# Patient Record
Sex: Male | Born: 1990 | Race: White | Hispanic: No | Marital: Single | Smoking: Never smoker
Health system: Southern US, Community
[De-identification: ages and names within clinical notes are randomized; demographics above are authoritative.]

## PROBLEM LIST (undated history)

## (undated) DIAGNOSIS — B2790 Infectious mononucleosis, unspecified without complication: Secondary | ICD-10-CM

## (undated) DIAGNOSIS — L708 Other acne: Secondary | ICD-10-CM

## (undated) DIAGNOSIS — T7840XA Allergy, unspecified, initial encounter: Secondary | ICD-10-CM

## (undated) HISTORY — DX: Other acne: L70.8

## (undated) HISTORY — DX: Infectious mononucleosis, unspecified without complication: B27.90

## (undated) HISTORY — DX: Allergy, unspecified, initial encounter: T78.40XA

---
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
## 2009-06-14 ENCOUNTER — Telehealth: Payer: Self-pay | Admitting: Internal Medicine

## 2009-07-30 ENCOUNTER — Telehealth: Payer: Self-pay | Admitting: Internal Medicine

## 2009-08-06 ENCOUNTER — Telehealth: Payer: Self-pay | Admitting: Internal Medicine

## 2009-08-09 ENCOUNTER — Ambulatory Visit: Payer: Self-pay | Admitting: Internal Medicine

## 2009-08-16 LAB — CONVERTED CEMR LAB
ALT: 17 units/L (ref 0–53)
AST: 22 units/L (ref 0–37)
BUN: 19 mg/dL (ref 6–23)
Calcium: 9.6 mg/dL (ref 8.4–10.5)
Eosinophils Relative: 0.8 % (ref 0.0–5.0)
GFR calc non Af Amer: 96.09 mL/min (ref >60)
HCT: 46.9 % (ref 39.0–52.0)
Hemoglobin: 16.4 g/dL (ref 13.0–17.0)
Lymphs Abs: 2.8 10*3/uL (ref 0.7–4.0)
MCHC: 34.9 g/dL (ref 30.0–36.0)
Neutro Abs: 3.3 10*3/uL (ref 1.4–7.7)
Total Bilirubin: 0.5 mg/dL (ref 0.3–1.2)
WBC: 6.7 10*3/uL (ref 4.5–10.5)

## 2009-12-24 ENCOUNTER — Ambulatory Visit: Payer: Self-pay | Admitting: Internal Medicine

## 2010-02-05 NOTE — Progress Notes (Signed)
Summary: Lotrisone not effective  Phone Note Call from Patient Call back at [phone]   Caller: Mom Call For: Stacie Glaze MD Reason for Call: Acute Illness Complaint: Urinary/GYN Problems Summary of Call: Triage VM from pt mother reporting the Lotrisone is not working.  It's been 3 weeks and symptoms seem to be getting worse [facility] Initial call taken by: Sid Falcon LPN,  July 30, 2009 4:16 PM  Follow-up for Phone Call        ov with dr Abner Greenspan for tomorrow Follow-up by: Willy Eddy, LPN,  July 30, 2009 4:25 PM

## 2010-02-05 NOTE — Progress Notes (Signed)
Summary: Lotrisone  Phone Note Call from Patient   Caller: Patient Call For: Stacie Glaze MD Summary of Call: [facility]) Refill Lotrisone for fungus patches on arm, shoulder and abdomen. [phone] Initial call taken by: Lynann Beaver CMA,  June 14, 2009 3:11 PM    Prescriptions: LOTRISONE 1-0.05 % LOTN (CLOTRIMAZOLE-BETAMETHASONE) Apply to affected area twice a day for 7 to 14 days  #60gm x 0   Entered by:   Lynann Beaver CMA   Authorized by:   Stacie Glaze MD   Signed by:   Lynann Beaver CMA on 06/14/2009   Method used:   Electronically to        [facility]. [phone]* (retail)       [address]       [place], [place]  [address]       Ph: [phone]       Fax: [phone]   RxID:   [id number]  Refill x 1 per Dr. Lovell Sheehan

## 2010-02-05 NOTE — Progress Notes (Signed)
Summary: REQ FOR CPX APPT  Phone Note Call from Patient   Caller: Mom Summary of Call: Pts mom called to reschedule her son's bumped appt..... His appt was for today, 8/1 ..... Pts mom adv that her son leaves to go back to college on 8/13 and willl need to have his cpx before he leaves.... would like to see if pt can be worked into the schedule before 8/13.... Pts mom can be reached at  [phone] with any questions or concerns.   Initial call taken by: Debbra Riding,  August 06, 2009 8:55 AM  Follow-up for Phone Call        Tim Lair may add him to wednesday afternoon this week Follow-up by: Stacie Glaze MD,  August 06, 2009 11:46 PM  Additional Follow-up for Phone Call Additional follow up Details #1::        Pt has another appt that afternoon.  Scheduled for 8/4 @ 9:15pm Additional Follow-up by: Trixie Dredge,  August 07, 2009 9:17 AM

## 2010-02-05 NOTE — Assessment & Plan Note (Signed)
Summary: CPX//SLM   Vital Signs:  Patient profile:   20 year old male Height:      73 inches Weight:      163 pounds BMI:     21.58 Temp:     97.8 degrees F oral Pulse rate:   79 / minute BP sitting:   110 / 78  (left arm) Cuff size:   regular  Vitals Entered By: Kathrynn Speed CMA (August 09, 2009 9:05 AM) CC: cpx, not fasting, src Is Patient Diabetic? No Pain Assessment Patient in pain? no      Nutritional Status BMI of 19 -24 = normal  Does patient need assistance? Ambulation Normal   CC:  cpx, not fasting, and src.  History of Present Illness: Pt is  [employer] at [employer] in psych and going pre med no problems had breakfast The pt was asked about all immunizations, health maint. 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 Apply To Affected Area Twice A Day For 7 To 14 Days  Current Medications (verified): 1)  Lotrisone 1-0.05 % Lotn (Clotrimazole-Betamethasone) .... Apply To Affected Area Twice A Day For 7 To 14 Days  Allergies (verified): No Known Drug Allergies  Past History:  Family History: Last updated: 07/19/2007 Family History Hypertension  Social History: Last updated: 07/19/2007 Occupation: Never Smoked  Risk Factors: Caffeine Use: limited (08/09/2009)  Risk Factors: Smoking Status: never (08/09/2009)  Past medical, surgical, family and social histories (including risk factors) reviewed,  and no changes noted (except as noted below).  Past Medical History: Reviewed history from 07/28/2006 and no changes required. acne 706.1 mononuecelosis/hx of075.0 Allergic rhinitis  Family History: Reviewed history from 07/19/2007 and no changes required. Family History Hypertension  Social History: Reviewed history from 07/19/2007 and no changes required. Occupation: Never Smoked Caffeine use/day:  limited  Review of Systems  The patient denies anorexia, fever, weight loss, weight gain, vision loss, decreased hearing, hoarseness, chest pain, syncope, dyspnea on exertion, peripheral edema, prolonged cough, headaches, hemoptysis, abdominal pain, melena, hematochezia, severe indigestion/heartburn, hematuria, incontinence, genital sores, muscle weakness, suspicious skin lesions, transient blindness, difficulty walking, depression, unusual weight change, abnormal bleeding, enlarged lymph nodes, angioedema, breast masses, and testicular masses.    Physical Exam  General:  alert, well-developed, and well-nourished.   Head:  normocephalic and atraumatic.   Eyes:  pupils equal and pupils round.   Ears:  R ear normal and L ear normal.   Nose:  no external deformity and no nasal discharge.   Lungs:  normal respiratory effort and no wheezes.   Abdomen:  Bowel sounds positive,abdomen soft and non-tender without masses, organomegaly or hernias noted. Rectal:  No external abnormalities noted. Normal sphincter tone. No rectal masses or tenderness. Genitalia:  Testes bilaterally descended without nodularity, tenderness or masses. No scrotal masses or lesions. No penis lesions or urethral discharge. Prostate:  Prostate gland firm and smooth, no enlargement, nodularity, tenderness, mass, asymmetry or induration. Msk:  No deformity or scoliosis noted of thoracic or lumbar spine.   Pulses:  R and L carotid,radial,femoral,dorsalis pedis and posterior tibial pulses are full and equal  bilaterally Extremities:  No clubbing, cyanosis, edema, or deformity noted with normal full range of motion of all joints.   Neurologic:  No cranial nerve deficits noted. Station and gait are normal. Plantar reflexes are down-going bilaterally. DTRs are symmetrical throughout. Sensory, motor and coordinative functions appear intact.   Impression & Recommendations:  Problem # 1:  PREVENTIVE HEALTH CARE (ICD-V70.0) The pt was asked about all immunizations, health maint. services that are appropriate to their age and was given guidance on diet exercize  and weight management  Td Booster: Tdap (State) (11/10/2005)   Flu Vax: Fluvax 3+ (12/26/2008)    Discussed using sunscreen, use of alcohol, drug use, self testicular exam, routine dental care, routine eye care, routine physical exam, seat belts, multiple vitamins, osteoporosis prevention, adequate calcium intake in diet, and recommendations for immunizations.  Discussed exercise and checking cholesterol.  Discussed gun safety, safe sex, and contraception. Also recommend checking PSA.  Problem # 2:  PITYRIASIS VERSICOLOR (ICD-111.0)  His updated medication list for this problem includes:    Lotrisone 1-0.05 % Lotn (Clotrimazole-betamethasone) .Marland Kitchen... Apply to affected area twice a day for 7 to 14 days  Take medication as directed for full duration.   Complete Medication List: 1)  Lotrisone 1-0.05 % Lotn (Clotrimazole-betamethasone) .... Apply to affected area twice a day for 7 to 14 days  Patient Instructions: 1)  Please schedule a follow-up appointment in 1 year.     Orders Added: 1)  Est. Patient Level IV [16109] 2)  Est. Patient 18-39 years [99395]    Prevention & Chronic Care Immunizations   Influenza vaccine: Fluvax 3+  (12/26/2008)   Influenza vaccine due: 09/06/2009    Tetanus booster: 11/10/2005: Tdap [facility])   Tetanus booster due: 11/11/2015    Pneumococcal vaccine: Not documented  Other Screening   Smoking status:  never  (08/09/2009)  Appended Document: Orders Update    Clinical Lists Changes  Orders: Added new Service order of Venipuncture (60454) - Signed Added new Test order of TLB-BMP (Basic Metabolic Panel-BMET) (80048-METABOL) - Signed Added new Test order of TLB-CBC Platelet - w/Differential (85025-CBCD) - Signed Added new Test order of TLB-Hepatic/Liver Function Pnl (80076-HEPATIC) - Signed Added new Test order of TLB-TSH (Thyroid Stimulating Hormone) (84443-TSH) - Signed Added new Test order of TLB-Cholesterol, HDL (83718-HDL) - Signed Added new Test order of TLB-Cholesterol, Direct LDL (83721-DIRLDL) - Signed Added new Test order of TLB-Cholesterol, Total (82465-CHO) - Signed

## 2010-02-07 NOTE — Assessment & Plan Note (Signed)
Summary: FLU SHOT//ALP PT WILL COME IN @ 12:30PM//ALP  Nurse Visit   Review of Systems       Flu Vaccine Consent Questions     Do you have a history of severe allergic reactions to this vaccine? no    Any prior history of allergic reactions to egg and/or gelatin? no    Do you have a sensitivity to the preservative Thimersol? no    Do you have a past history of Guillan-Barre Syndrome? no    Do you currently have an acute febrile illness? no    Have you ever had a severe reaction to latex? no    Vaccine information given and explained to patient? yes    Are you currently pregnant? no    Lot Number:[id number]   Exp Date:07/06/2010   Site Given  Left Deltoid IM    Allergies: No Known Drug Allergies  Orders Added: 1)  Admin 1st Vaccine [90471] 2)  Flu Vaccine 70yrs + [11914]

## 2010-05-27 ENCOUNTER — Other Ambulatory Visit: Payer: Self-pay | Admitting: Internal Medicine

## 2010-07-30 ENCOUNTER — Encounter: Payer: Self-pay | Admitting: Internal Medicine

## 2010-07-30 ENCOUNTER — Ambulatory Visit (INDEPENDENT_AMBULATORY_CARE_PROVIDER_SITE_OTHER): Payer: BC Managed Care – PPO | Admitting: Internal Medicine

## 2010-07-30 DIAGNOSIS — M549 Dorsalgia, unspecified: Secondary | ICD-10-CM

## 2010-07-30 DIAGNOSIS — S91319A Laceration without foreign body, unspecified foot, initial encounter: Secondary | ICD-10-CM

## 2010-07-30 DIAGNOSIS — S91309A Unspecified open wound, unspecified foot, initial encounter: Secondary | ICD-10-CM
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
## 2011-01-03 ENCOUNTER — Ambulatory Visit (INDEPENDENT_AMBULATORY_CARE_PROVIDER_SITE_OTHER): Payer: BC Managed Care – PPO | Admitting: Family

## 2011-01-03 ENCOUNTER — Encounter: Payer: Self-pay | Admitting: Family

## 2011-01-03 VITALS — BP 120/80 | Temp 98.2°F | Wt 188.0 lb

## 2011-01-03 DIAGNOSIS — M26629 Arthralgia of temporomandibular joint, unspecified side: Secondary | ICD-10-CM

## 2011-01-03 MED ORDER — METHYLPREDNISOLONE ACETATE 80 MG/ML IJ SUSP
80.0000 mg | Freq: Once | INTRAMUSCULAR | Status: AC
  Administered 2011-01-03: 80 mg via INTRAMUSCULAR

## 2011-01-03 MED ORDER — MELOXICAM 15 MG PO TABS: 15.0000 mg | ORAL_TABLET | Freq: Every day | ORAL | Status: AC

## 2011-01-03 MED ORDER — METHYLPREDNISOLONE ACETATE 80 MG/ML IJ SUSP: 80.0000 mg | Freq: Once | INTRAMUSCULAR | Status: DC

## 2011-01-03 NOTE — Progress Notes (Signed)
  Subjective:    Patient ID: Timothy Nash, male    DOB: 04-04-1990, 20 y.o.   MRN: [id number]  HPI 20 year old white male, nonsmoker, patient of Dr. Lovell Sheehan is him with a one-day history of left jaw pain. He denies any recent cough and, congestion, dental pain, fever, and muscle aches.    Review of Systems  HENT: Positive for rhinorrhea.   Respiratory: Negative.   Cardiovascular: Negative.   Musculoskeletal: Negative.   Skin: Negative.   Neurological: Negative.   Hematological: Negative.   Psychiatric/Behavioral: Negative.    Past Medical History  Diagnosis Date  . Other acne   . Infectious mononucleosis history of  . Allergy     History   Social History  . Marital Status: Single    Spouse Name: N/A    Number of Children: N/A  . Years of Education: N/A   Occupational History  . Not on file.   Social History Main Topics  . Smoking status: Never Smoker   . Smokeless tobacco: Not on file  . Alcohol Use: No  . Drug Use: No  . Sexually Active: Yes   Other Topics Concern  . Not on file   Social History Narrative  . No narrative on file    No past surgical history on file.  Family History  Problem Relation Age of Onset  . Hypertension      No Known Allergies  Current Outpatient Prescriptions on File Prior to Visit  Medication Sig Dispense Refill  . clotrimazole-betamethasone (LOTRISONE) lotion APPLY TO AFFECTED AREA TWICE DAILY FOR 7 TO 14 DAYS  60 mL  0   No current facility-administered medications on file prior to visit.    BP 120/80  Temp(Src) 98.2 F (36.8 C) (Oral)  Wt 188 lb (85.276 kg)chart    Objective:   Physical Exam  Constitutional: He is oriented to person, place, and time. He appears well-developed and well-nourished.  HENT:  Right Ear: External ear normal.  Left Ear: External ear normal.  Mouth/Throat: Oropharynx is clear and moist.  Neck: Normal range of motion. Neck supple.       Tenderness to palpation of the left TMJ. Pain with  open and closing opening the jaw.  Cardiovascular: Normal rate, regular rhythm and normal heart sounds.   Pulmonary/Chest: Effort normal and breath sounds normal.  Musculoskeletal: Normal range of motion.  Neurological: He is alert and oriented to person, place, and time.  Skin: Skin is warm and dry.          Assessment & Plan:  Assessment: TMJ  Plan: Depo-Medrol 80 mg IM x1. Mobic 15 mg one by mouth daily. Compresses to the affected area. Patient to call if symptoms worsen or persist. Recheck as scheduled, and when necessary.

## 2011-01-03 NOTE — Patient Instructions (Signed)
Temporomandibular Joint Pain Your exam shows that you have a problem with your temporomandibular joint (TMJ), the joint that moves when you open your mouth or chew food. TMJ problems can result from direct injuries, bite abnormalities, or tension states which cause you to grind or clench your teeth. Typical symptoms include pain around the joint, clicking, restricted movement, and headaches. The TMJ is like any other joint in the body; when it is strained, it needs rest to repair itself. To keep the joint at rest it is important that you do not open your mouth wider than the width of your index finger. If you must yawn, be sure to support your chin with your hand so your mouth does not open wide. Eat a soft diet (nothing firmer than ground beef, no raw vegetables), do not chew gum and do not talk if it causes you pain. Apply topical heat by using a warm, moist cloth placed in front of the ear for 15 to 20 minutes several times daily. Alternating heat and ice may give even more relief. Anti-inflammatory pain medicine and muscle relaxants can also be helpful. A dental orthotic or splint may be used for temporary relief. Long-term problems may require treatment for stress as well as braces or surgery. Please check with your doctor or dentist if your symptoms do not improve within one week. Document Released: 01/31/2004 Document Revised: 09/04/2010 Document Reviewed: 12/23/2004 [facility] Patient Information 2012 [place], [place].

## 2011-01-06 ENCOUNTER — Ambulatory Visit: Payer: BC Managed Care – PPO | Admitting: Family

## 2011-12-22 ENCOUNTER — Other Ambulatory Visit: Payer: BC Managed Care – PPO

## 2011-12-24 ENCOUNTER — Other Ambulatory Visit: Payer: Self-pay | Admitting: *Deleted

## 2011-12-25 ENCOUNTER — Other Ambulatory Visit (INDEPENDENT_AMBULATORY_CARE_PROVIDER_SITE_OTHER): Payer: BC Managed Care – PPO

## 2011-12-25 DIAGNOSIS — Z0000 Encounter for general adult medical examination without abnormal findings: Secondary | ICD-10-CM

## 2011-12-25 LAB — CBC WITH DIFFERENTIAL/PLATELET
Basophils Absolute: 0 10*3/uL (ref 0.0–0.1)
Basophils Relative: 0.7 % (ref 0.0–3.0)
Eosinophils Absolute: 0.1 10*3/uL (ref 0.0–0.7)
Eosinophils Relative: 2.2 % (ref 0.0–5.0)
Hemoglobin: 16.4 g/dL (ref 13.0–17.0)
Lymphocytes Relative: 45.2 % (ref 12.0–46.0)
Lymphs Abs: 2.7 10*3/uL (ref 0.7–4.0)
MCHC: 34.3 g/dL (ref 30.0–36.0)
MCV: 90.1 fl (ref 78.0–100.0)
Monocytes Absolute: 0.6 10*3/uL (ref 0.1–1.0)
Monocytes Relative: 9.7 % (ref 3.0–12.0)
Neutrophils Relative %: 42.2 % — ABNORMAL LOW (ref 43.0–77.0)
Platelets: 203 10*3/uL (ref 150.0–400.0)
RBC: 5.3 Mil/uL (ref 4.22–5.81)
WBC: 5.9 10*3/uL (ref 4.5–10.5)

## 2011-12-25 LAB — BASIC METABOLIC PANEL
Calcium: 9.1 mg/dL (ref 8.4–10.5)
Chloride: 103 mEq/L (ref 96–112)
Creatinine, Ser: 1 mg/dL (ref 0.4–1.5)
Glucose, Bld: 91 mg/dL (ref 70–99)
Sodium: 140 mEq/L (ref 135–145)

## 2011-12-25 LAB — POCT URINALYSIS DIPSTICK
Bilirubin, UA: NEGATIVE
Ketones, UA: NEGATIVE
Leukocytes, UA: NEGATIVE
Nitrite, UA: NEGATIVE
Protein, UA: NEGATIVE
pH, UA: 7

## 2011-12-25 LAB — LIPID PANEL
HDL: 87.9 mg/dL (ref >39.00)
Total CHOL/HDL Ratio: 2
Triglycerides: 62 mg/dL (ref 0.0–149.0)

## 2011-12-25 LAB — HEPATIC FUNCTION PANEL
AST: 19 U/L (ref 0–37)
Alkaline Phosphatase: 77 U/L (ref 39–117)

## 2011-12-25 LAB — TSH: TSH: 0.63 u[IU]/mL (ref 0.35–5.50)

## 2011-12-29 ENCOUNTER — Encounter: Payer: BC Managed Care – PPO | Admitting: Internal Medicine

## 2012-01-06 ENCOUNTER — Ambulatory Visit (INDEPENDENT_AMBULATORY_CARE_PROVIDER_SITE_OTHER): Payer: BC Managed Care – PPO | Admitting: Internal Medicine

## 2012-01-06 ENCOUNTER — Encounter: Payer: Self-pay | Admitting: Internal Medicine

## 2012-01-06 VITALS — BP 130/80 | HR 68 | Temp 98.3°F | Resp 14 | Ht 73.0 in | Wt 188.0 lb

## 2012-01-06 DIAGNOSIS — Z0000 Encounter for general adult medical examination without abnormal findings: Secondary | ICD-10-CM

## 2012-01-06 NOTE — Progress Notes (Signed)
  Subjective:    Patient ID: Timothy Nash, male    DOB: 1990/08/11, 21 y.o.   MRN: [id number]  HPI CPX  No complaints Cold symptoms and PNDrip  Review of Systems  Constitutional: Negative for fever and fatigue.  HENT: Negative for hearing loss, congestion, neck pain and postnasal drip.   Eyes: Negative for discharge, redness and visual disturbance.  Respiratory: Negative for cough, shortness of breath and wheezing.   Cardiovascular: Negative for leg swelling.  Gastrointestinal: Negative for abdominal pain, constipation and abdominal distention.  Genitourinary: Negative for urgency and frequency.  Musculoskeletal: Negative for joint swelling and arthralgias.  Skin: Negative for color change and rash.  Neurological: Negative for weakness and light-headedness.  Hematological: Negative for adenopathy.  Psychiatric/Behavioral: Negative for behavioral problems.   Past Medical History  Diagnosis Date  . Other acne   . Infectious mononucleosis history of  . Allergy     History   Social History  . Marital Status: Single    Spouse Name: N/A    Number of Children: N/A  . Years of Education: N/A   Occupational History  . Not on file.   Social History Main Topics  . Smoking status: Never Smoker   . Smokeless tobacco: Not on file  . Alcohol Use: No  . Drug Use: No  . Sexually Active: Yes   Other Topics Concern  . Not on file   Social History Narrative  . No narrative on file    No past surgical history on file.  Family History  Problem Relation Age of Onset  . Hypertension      No Known Allergies  No current outpatient prescriptions on file prior to visit.    BP 130/80  Pulse 68  Temp 98.3 F (36.8 C)  Resp 14  Ht 6\' 1"  (1.854 m)  Wt 188 lb (85.276 kg)  BMI 24.80 kg/m2       Objective:   Physical Exam  Nursing note and vitals reviewed. Constitutional: He is oriented to person, place, and time. He appears well-developed and well-nourished.  HENT:    Head: Normocephalic and atraumatic.  Eyes: Conjunctivae normal are normal. Pupils are equal, round, and reactive to light.  Neck: Normal range of motion. Neck supple.  Cardiovascular: Normal rate and regular rhythm.   Pulmonary/Chest: Effort normal and breath sounds normal.  Abdominal: Soft. Bowel sounds are normal.  Genitourinary: Penis normal.  Neurological: He is alert and oriented to person, place, and time.  Skin: Skin is warm and dry.          Assessment & Plan:   Patient presents for yearly preventative medicine examination.   all immunizations and health maintenance protocols were reviewed with the patient and they are up to date with these protocols.   screening laboratory values were reviewed with the patient including screening of hyperlipidemia PSA renal function and hepatic function.   There medications past medical history social history problem list and allergies were reviewed in detail.   Goals were established with regard to weight loss exercise diet in compliance with medications

## 2012-01-06 NOTE — Patient Instructions (Signed)
The patient is instructed to continue all medications as prescribed. Schedule followup with check out clerk upon leaving the clinic  

## 2012-07-02 ENCOUNTER — Telehealth: Payer: Self-pay | Admitting: Internal Medicine

## 2012-07-02 MED ORDER — KETOCONAZOLE 2 % EX SHAM: MEDICATED_SHAMPOO | CUTANEOUS | Status: DC

## 2012-07-02 NOTE — Telephone Encounter (Signed)
Pt mother is requesting nizoral cream call into [facility] [address] for white spots on back. Pt mother stated patient get this every yr. Pt unable to come in lives in [place] and will be going to [place] on 07-10-12

## 2012-08-16 ENCOUNTER — Telehealth: Payer: Self-pay | Admitting: Internal Medicine

## 2012-08-16 NOTE — Telephone Encounter (Signed)
yes

## 2012-08-16 NOTE — Telephone Encounter (Signed)
Pt needs cpx after 12/30 and before 01/12/13. Is it ok to work in the first week Jan?

## 2012-08-17 NOTE — Telephone Encounter (Signed)
appt made/kh 

## 2013-01-03 ENCOUNTER — Other Ambulatory Visit: Payer: BC Managed Care – PPO

## 2013-01-10 ENCOUNTER — Encounter: Payer: Self-pay | Admitting: Internal Medicine

## 2013-01-10 ENCOUNTER — Other Ambulatory Visit (INDEPENDENT_AMBULATORY_CARE_PROVIDER_SITE_OTHER): Payer: No Typology Code available for payment source

## 2013-01-10 ENCOUNTER — Ambulatory Visit (INDEPENDENT_AMBULATORY_CARE_PROVIDER_SITE_OTHER): Payer: No Typology Code available for payment source | Admitting: Internal Medicine

## 2013-01-10 VITALS — BP 130/80 | HR 76 | Temp 98.0°F | Resp 16 | Ht 73.0 in | Wt 188.0 lb

## 2013-01-10 DIAGNOSIS — Z0000 Encounter for general adult medical examination without abnormal findings: Secondary | ICD-10-CM

## 2013-01-10 DIAGNOSIS — S62523A Displaced fracture of distal phalanx of unspecified thumb, initial encounter for closed fracture: Secondary | ICD-10-CM

## 2013-01-10 DIAGNOSIS — L659 Nonscarring hair loss, unspecified: Secondary | ICD-10-CM

## 2013-01-10 DIAGNOSIS — S62639A Displaced fracture of distal phalanx of unspecified finger, initial encounter for closed fracture: Secondary | ICD-10-CM

## 2013-01-10 LAB — HEPATIC FUNCTION PANEL
ALK PHOS: 74 U/L (ref 39–117)
ALT: 25 U/L (ref 0–53)
AST: 19 U/L (ref 0–37)
Albumin: 4.7 g/dL (ref 3.5–5.2)
Bilirubin, Direct: 0.1 mg/dL (ref 0.0–0.3)
Total Bilirubin: 0.7 mg/dL (ref 0.3–1.2)
Total Protein: 7.5 g/dL (ref 6.0–8.3)

## 2013-01-10 LAB — LIPID PANEL
CHOL/HDL RATIO: 3
Cholesterol: 203 mg/dL — ABNORMAL HIGH (ref 0–200)
HDL: 68.2 mg/dL (ref >39.00)
TRIGLYCERIDES: 76 mg/dL (ref 0.0–149.0)
VLDL: 15.2 mg/dL (ref 0.0–40.0)

## 2013-01-10 LAB — BASIC METABOLIC PANEL
BUN: 13 mg/dL (ref 6–23)
CO2: 27 meq/L (ref 19–32)
Calcium: 9.6 mg/dL (ref 8.4–10.5)
Chloride: 105 mEq/L (ref 96–112)
Creatinine, Ser: 1.1 mg/dL (ref 0.4–1.5)
GFR: 85.45 mL/min (ref >60.00)
GLUCOSE: 88 mg/dL (ref 70–99)
Potassium: 4.7 mEq/L (ref 3.5–5.1)
Sodium: 140 mEq/L (ref 135–145)

## 2013-01-10 LAB — POCT URINALYSIS DIPSTICK
BILIRUBIN UA: NEGATIVE
Blood, UA: NEGATIVE
GLUCOSE UA: NEGATIVE
Ketones, UA: NEGATIVE
Nitrite, UA: NEGATIVE
PROTEIN UA: NEGATIVE
Spec Grav, UA: 1.02
Urobilinogen, UA: 0.2
pH, UA: 6.5

## 2013-01-10 LAB — CBC WITH DIFFERENTIAL/PLATELET
BASOS PCT: 0.5 % (ref 0.0–3.0)
Basophils Absolute: 0 10*3/uL (ref 0.0–0.1)
EOS PCT: 2.8 % (ref 0.0–5.0)
Eosinophils Absolute: 0.2 10*3/uL (ref 0.0–0.7)
HEMATOCRIT: 49.9 % (ref 39.0–52.0)
Hemoglobin: 17.1 g/dL — ABNORMAL HIGH (ref 13.0–17.0)
LYMPHS PCT: 52.3 % — AB (ref 12.0–46.0)
Lymphs Abs: 3.4 10*3/uL (ref 0.7–4.0)
MCHC: 34.2 g/dL (ref 30.0–36.0)
MCV: 90 fl (ref 78.0–100.0)
Monocytes Absolute: 0.6 10*3/uL (ref 0.1–1.0)
Monocytes Relative: 9.7 % (ref 3.0–12.0)
NEUTROS ABS: 2.3 10*3/uL (ref 1.4–7.7)
NEUTROS PCT: 34.7 % — AB (ref 43.0–77.0)
Platelets: 237 10*3/uL (ref 150.0–400.0)
RBC: 5.55 Mil/uL (ref 4.22–5.81)
RDW: 14.1 % (ref 11.5–14.6)
WBC: 6.6 10*3/uL (ref 4.5–10.5)

## 2013-01-10 LAB — LDL CHOLESTEROL, DIRECT: LDL DIRECT: 115.6 mg/dL

## 2013-01-10 LAB — TSH: TSH: 1.23 u[IU]/mL (ref 0.35–5.50)

## 2013-01-10 MED ORDER — MINOXIDIL 2 % EX SOLN: Freq: Every day | CUTANEOUS | Status: AC

## 2013-01-10 NOTE — Addendum Note (Signed)
Addended by: Stacie GlazeJENKINS, JOHN E on: 01/10/2013 04:48 PM   Modules accepted: Orders

## 2013-01-10 NOTE — Progress Notes (Signed)
   Subjective:    Patient ID: Timothy Nash, male    DOB: 1990-04-09, 23 y.o.   MRN: [id number]  HPI Scalp had used olux and this did not help Has very dry skin CPX   Review of Systems  Constitutional: Negative for fever and fatigue.  HENT: Negative for congestion, hearing loss and postnasal drip.   Eyes: Negative for discharge, redness and visual disturbance.  Respiratory: Negative for cough, shortness of breath and wheezing.   Cardiovascular: Negative for leg swelling.  Gastrointestinal: Negative for abdominal pain, constipation and abdominal distention.  Genitourinary: Negative for urgency and frequency.  Musculoskeletal: Negative for arthralgias, joint swelling and neck pain.  Skin: Negative for color change and rash.  Neurological: Negative for weakness and light-headedness.  Hematological: Negative for adenopathy.  Psychiatric/Behavioral: Negative for behavioral problems.   Past Medical History  Diagnosis Date  . Other acne   . Infectious mononucleosis history of  . Allergy     History   Social History  . Marital Status: Single    Spouse Name: N/A    Number of Children: N/A  . Years of Education: N/A   Occupational History  . Not on file.   Social History Main Topics  . Smoking status: Never Smoker   . Smokeless tobacco: Not on file  . Alcohol Use: No  . Drug Use: No  . Sexual Activity: Yes   Other Topics Concern  . Not on file   Social History Narrative  . No narrative on file    History reviewed. No pertinent past surgical history.  Family History  Problem Relation Age of Onset  . Hypertension      No Known Allergies  No current outpatient prescriptions on file prior to visit.   No current facility-administered medications on file prior to visit.    BP 130/80  Pulse 76  Temp(Src) 98 F (36.7 C)  Resp 16  Ht 6\' 1"  (1.854 m)  Wt 188 lb (85.276 kg)  BMI 24.81 kg/m2       Objective:   Physical Exam  Nursing note and vitals  reviewed. Constitutional: He appears well-developed and well-nourished.  HENT:  Head: Normocephalic and atraumatic.  Eyes: Conjunctivae are normal. Pupils are equal, round, and reactive to light.  Neck: Normal range of motion. Neck supple.  Cardiovascular: Normal rate and regular rhythm.   Pulmonary/Chest: Effort normal and breath sounds normal.  Abdominal: Soft. Bowel sounds are normal.          Assessment & Plan:  Hair loss.... Rogaine trial   Strong family history CPX  Patient presents for yearly preventative medicine examination.   all immunizations and health maintenance protocols were reviewed with the patient and they are up to date with these protocols.   screening laboratory values were reviewed with the patient including screening of hyperlipidemia PSA renal function and hepatic function.   There medications past medical history social history problem list and allergies were reviewed in detail.   Goals were established with regard to weight loss exercise diet in compliance with medications

## 2013-01-10 NOTE — Progress Notes (Signed)
Pre visit review using our clinic review tool, if applicable. No additional management support is needed unless otherwise documented below in the visit note. 

## 2013-01-11 ENCOUNTER — Other Ambulatory Visit: Payer: Self-pay | Admitting: Internal Medicine

## 2013-01-11 ENCOUNTER — Ambulatory Visit (INDEPENDENT_AMBULATORY_CARE_PROVIDER_SITE_OTHER)
Admission: RE | Admit: 2013-01-11 | Discharge: 2013-01-11 | Disposition: A | Discharge status: Home / Self Care | Payer: No Typology Code available for payment source | Source: Ambulatory Visit | Attending: Internal Medicine | Admitting: Internal Medicine

## 2013-01-11 DIAGNOSIS — S62639A Displaced fracture of distal phalanx of unspecified finger, initial encounter for closed fracture: Secondary | ICD-10-CM

## 2013-01-11 DIAGNOSIS — S62523A Displaced fracture of distal phalanx of unspecified thumb, initial encounter for closed fracture: Secondary | ICD-10-CM

## 2013-01-24 ENCOUNTER — Other Ambulatory Visit: Payer: Self-pay | Admitting: *Deleted

## 2013-01-24 ENCOUNTER — Telehealth: Payer: Self-pay | Admitting: Internal Medicine

## 2013-01-24 DIAGNOSIS — S62515D Nondisplaced fracture of proximal phalanx of left thumb, subsequent encounter for fracture with routine healing: Secondary | ICD-10-CM

## 2013-01-24 NOTE — Telephone Encounter (Signed)
Pt would like results from lab work done approximately 2 weeks ago.

## 2013-02-15 ENCOUNTER — Telehealth: Payer: Self-pay | Admitting: Internal Medicine

## 2013-02-15 ENCOUNTER — Other Ambulatory Visit: Payer: Self-pay | Admitting: *Deleted

## 2013-02-15 DIAGNOSIS — S62515G Nondisplaced fracture of proximal phalanx of left thumb, subsequent encounter for fracture with delayed healing: Secondary | ICD-10-CM

## 2013-02-15 NOTE — Telephone Encounter (Signed)
Pt notified, order in computer

## 2013-02-15 NOTE — Telephone Encounter (Signed)
Pt mother is requesting another xray to make sure her son thumb has healed.

## 2013-02-17 ENCOUNTER — Ambulatory Visit
Admission: RE | Admit: 2013-02-17 | Discharge: 2013-02-17 | Disposition: A | Discharge status: Home / Self Care | Payer: No Typology Code available for payment source | Source: Ambulatory Visit | Attending: Internal Medicine | Admitting: Internal Medicine

## 2013-02-17 ENCOUNTER — Ambulatory Visit (INDEPENDENT_AMBULATORY_CARE_PROVIDER_SITE_OTHER)
Admission: RE | Admit: 2013-02-17 | Discharge: 2013-02-17 | Disposition: A | Discharge status: Home / Self Care | Payer: No Typology Code available for payment source | Source: Ambulatory Visit | Attending: Internal Medicine | Admitting: Internal Medicine

## 2013-02-17 DIAGNOSIS — S62515G Nondisplaced fracture of proximal phalanx of left thumb, subsequent encounter for fracture with delayed healing: Secondary | ICD-10-CM

## 2013-02-17 DIAGNOSIS — S5290XD Unspecified fracture of unspecified forearm, subsequent encounter for closed fracture with routine healing: Secondary | ICD-10-CM

## 2013-02-25 ENCOUNTER — Other Ambulatory Visit: Payer: Self-pay | Admitting: *Deleted

## 2013-02-25 ENCOUNTER — Telehealth: Payer: Self-pay | Admitting: Internal Medicine

## 2013-02-25 DIAGNOSIS — IMO0002 Reserved for concepts with insufficient information to code with codable children: Secondary | ICD-10-CM

## 2013-02-25 NOTE — Telephone Encounter (Signed)
Pt mother is calling requesting someone call her son with xray results

## 2015-03-03 ENCOUNTER — Telehealth: Payer: Self-pay | Admitting: Internal Medicine

## 2015-03-05 NOTE — Telephone Encounter (Signed)
[facility] - Client TELEPHONE ADVICE RECORD [facility] Call [facility] Patient Name: Timothy Nash Gender: Male DOB: 09/21/1990 Age: 25 Y 2 M 1 D Return Phone Number: [phone] (Primary) Address: City/State/Zip:  Client [facility] - Client Client Site [facility] - Night Contact Type Call Who Is Calling Patient / Member / Family / Caregiver Call Type Triage / Clinical Caller Name Veryl Winemiller Relationship To Patient Mother Return Phone Number [phone] (Primary) Chief Complaint Prescription Refill or Medication Request (non symptomatic) Reason for Call Symptomatic / Request for Health Information Initial Comment Caller states son has been exposed to the flu, requests Tamiflu so it can be started right away. Patient saw Dr. Lovell Sheehan before he left the practice, isn't sure who his Dr. is now Translation No Nurse Assessment Nurse: Josie Saunders, RN, Erskine Squibb Date/Time [name] Time): 03/03/2015 1:57:02 PM Confirm and document reason for call. If symptomatic, describe symptoms. You must click the next button to save text entered. ---Caller reports she is just leaving pediatric office with a sibling of patient. Just diagnosed with flu and is wanting Tamiflu ordered for her son. Is not currently with her son, Has the patient traveled out of the country within the last 30 days? ---Not Applicable Does the patient have any new or worsening symptoms? ---No Please document clinical information provided and list any resource used. ---Caller advised that her son must call back for him self regarding his own health situation. Caller verbalized understanding Guidelines Guideline Title Affirmed Question Affirmed Notes Nurse Date/Time (Eastern Time) Disp. Time [name] Time) Disposition Final User 03/03/2015 1:06:15 PM Send To Clinical Follow Up Darrick Penna, Amy 03/03/2015 1:07:06 PM Send To Clinical Follow Up  Darrick Penna, Amy 03/03/2015 1:59:34 PM Clinical Call Yes Josie Saunders, RN, Erskine Squibb

## 2015-03-05 NOTE — Telephone Encounter (Signed)
Left message on personally identified voicemail informing patient he needs to contact our office to scheduled an appt to establish care with another provider due to Dr. Lovell Sheehan no longer practicing at our office.

## 2015-03-05 NOTE — Telephone Encounter (Signed)
[facility] - Client TELEPHONE ADVICE RECORD [facility] Call Center Patient Name: Timothy Nash Gender: Male DOB: December 17, 1990 Age: 25 Y 2 M 1 D Return Phone Number: [phone] (Primary) Address: City/State/Zip: [place] Client [facility] - Client Client Site [facility] - Night Contact Type Call Who Is Calling Patient / Member / Family / Caregiver Call Type Triage / Clinical Caller Name sophie Dasaro Relationship To Patient Mother Return Phone Number [phone] (Primary) Chief Complaint Prescription Refill or Medication Request (non symptomatic) Reason for Call Symptomatic / Request for Health Information Initial Comment Caller States son has been exposed to the flu, request tamiflu so it can be started right away. pt. saw dr. Lovell Sheehan before he left the practice. isnt sure who his dr. is now. mother was told to call back when son was present so nurse could speak to him. Translation No Nurse Assessment Nurse: Annye English, RN, Angelique Blonder Date/Time (Eastern Time): 03/03/2015 3:37:03 PM Please select the assessment type ---Verbal order / New medication order Additional Documentation ---The caller has called previously for a Rx for Tamiflu for her son. See record [id number]. States her son was not with her on the prev call and told to cb when her son was with her. Caller again returns call and request refill for Tamiflu but the pt does not have a PCP estab with this practice. Prev MD left the practice and the pt has not been seen at the clinic by another MD yet. Does the client directives allow for assistance with medications after hours? ---No Additional Documentation ---Advised the pt will need to be eval at an [facility] for Tamiflu or he can call the [facility] on Mon to make an appt and request the Rx. Advised unable to call for a Rx if the pt is not estab with another PCP at this office and has not been seen at the  office since the prev PCP left. States ok, and abruptly disconnects. Guidelines Guideline Title Affirmed Question Affirmed Notes Nurse Date/Time (Eastern Time) Disp. Time [name] Time) Disposition Final User 03/03/2015 3:41:26 PM Clinical Call Yes Annye English, RN, Angelique Blonder

## 2015-09-03 IMAGING — CR DG FINGER THUMB 2+V*L*
3 series · 3 of 3 positions shown · non-contrast
Comparison: None.

CLINICAL DATA: Left thumb injury.

EXAM:
LEFT THUMB 2+V

[view not recorded (1 of 3)]
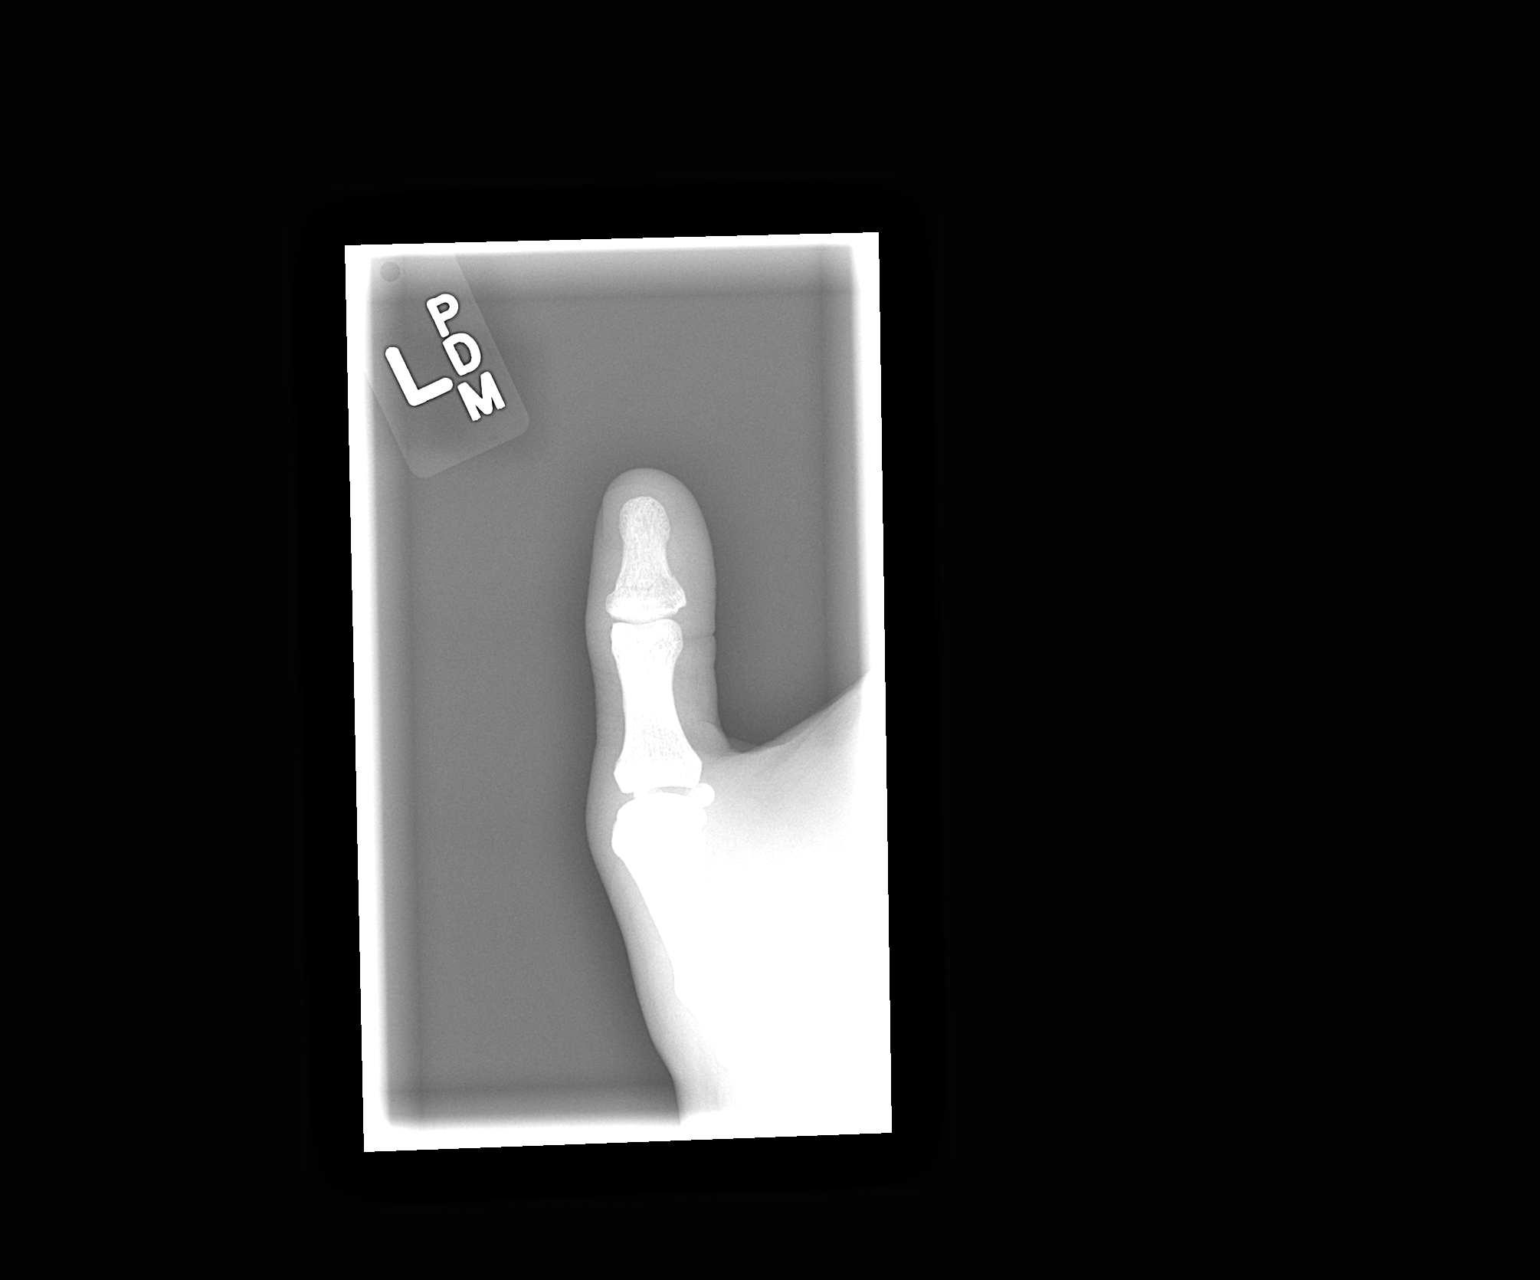

[view not recorded (2 of 3)]
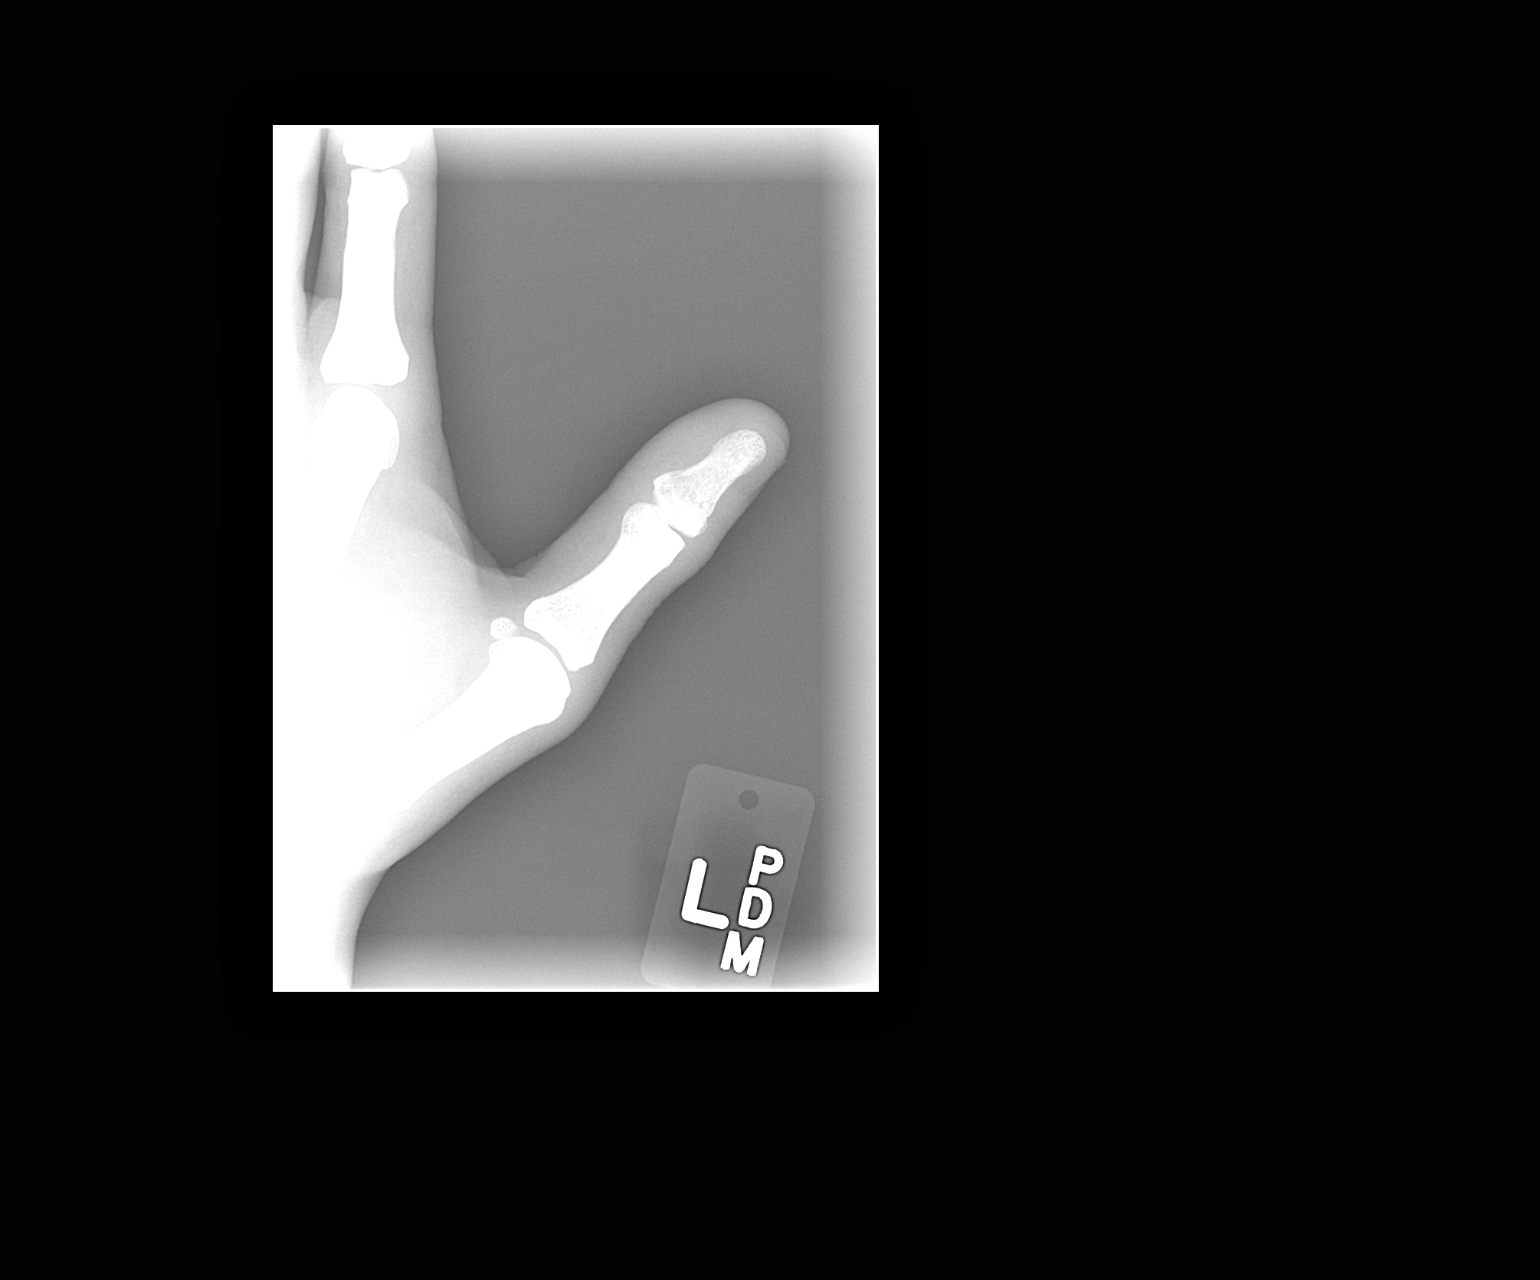

[view not recorded (3 of 3)]
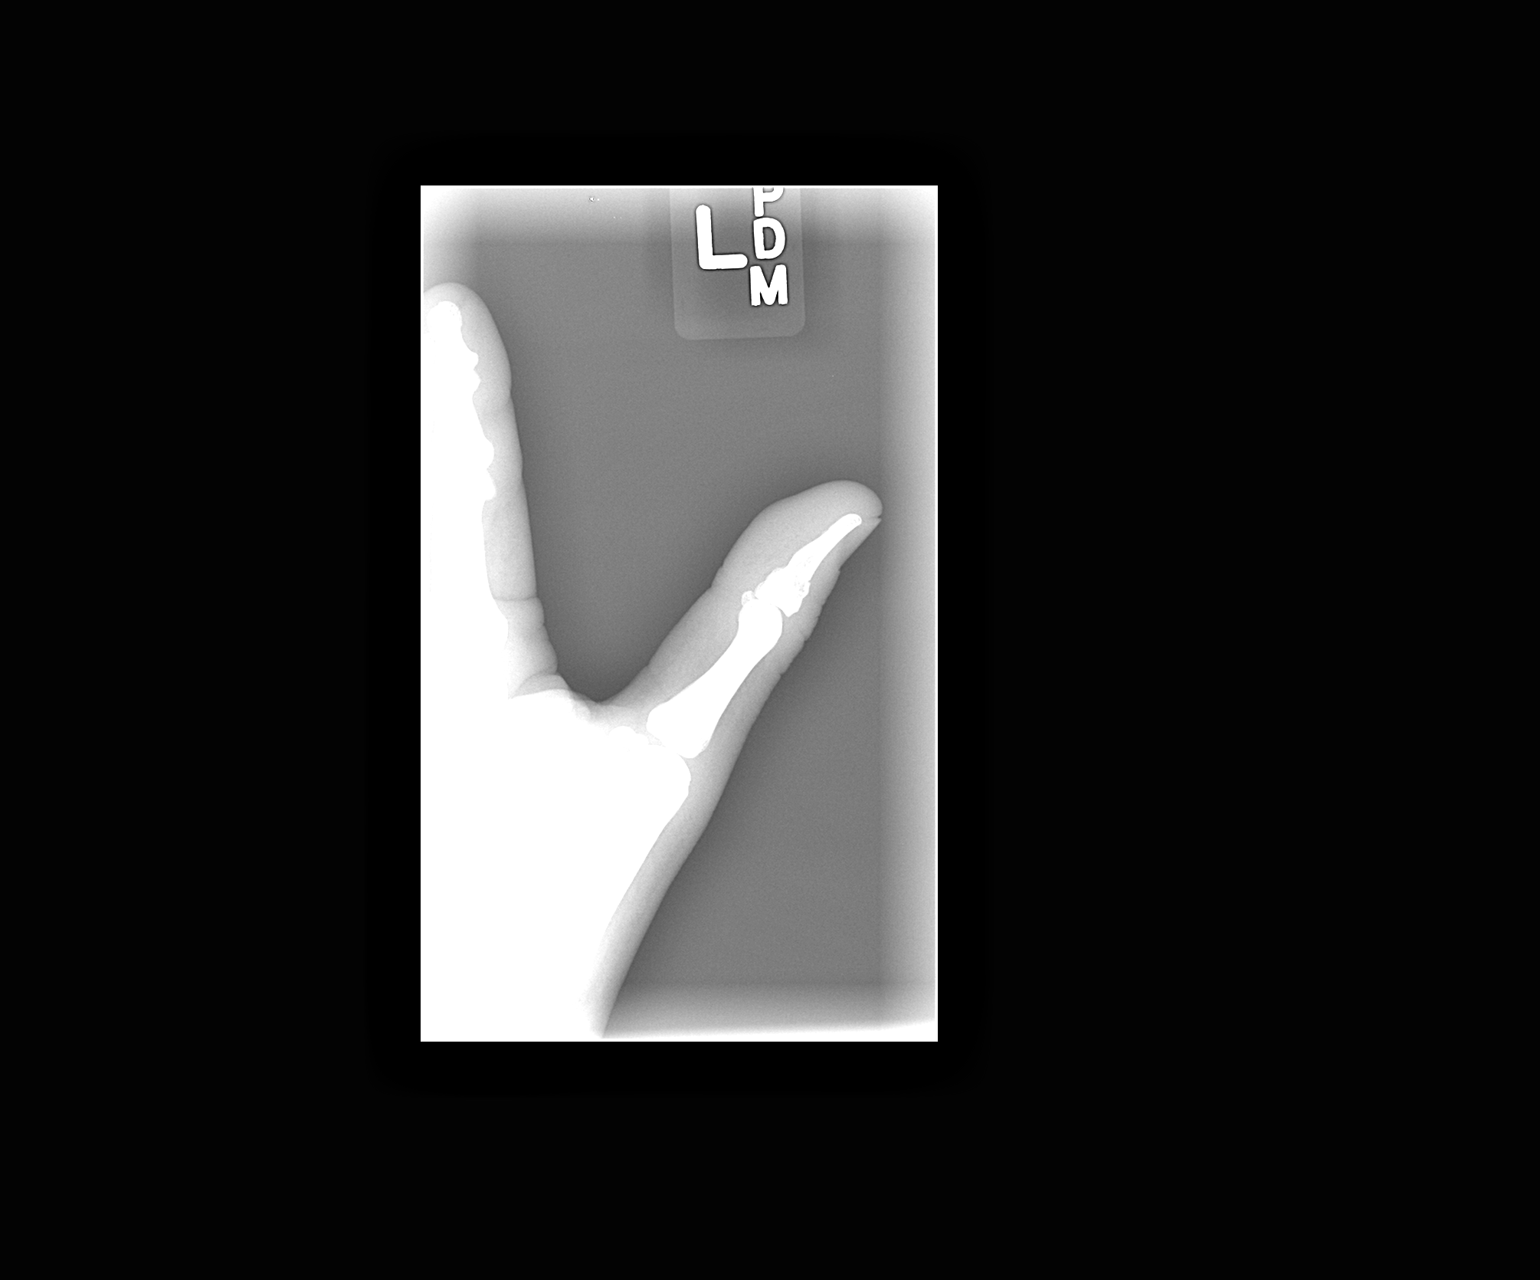

[3 of 3 positions shown; findings below may reference images not displayed]

FINDINGS: There is a nondisplaced incomplete fracture through the dorsal
cortex of the base of the distal phalanx of the left thumb. The
fracture does not extend to the IP joint. No other acute bony or
joint abnormality is identified.
IMPRESSION: Nondisplaced, incomplete fracture distal phalanx left thumb.

## 2015-10-10 IMAGING — CR DG FINGER THUMB 2+V*L*
2 series · 2 of 2 positions shown · non-contrast
Comparison: DG FINGER THUMB*L* dated 01/11/2013

CLINICAL DATA: Followup left thumb fracture

EXAM:
LEFT THUMB 2+V

[view not recorded (1 of 2)]
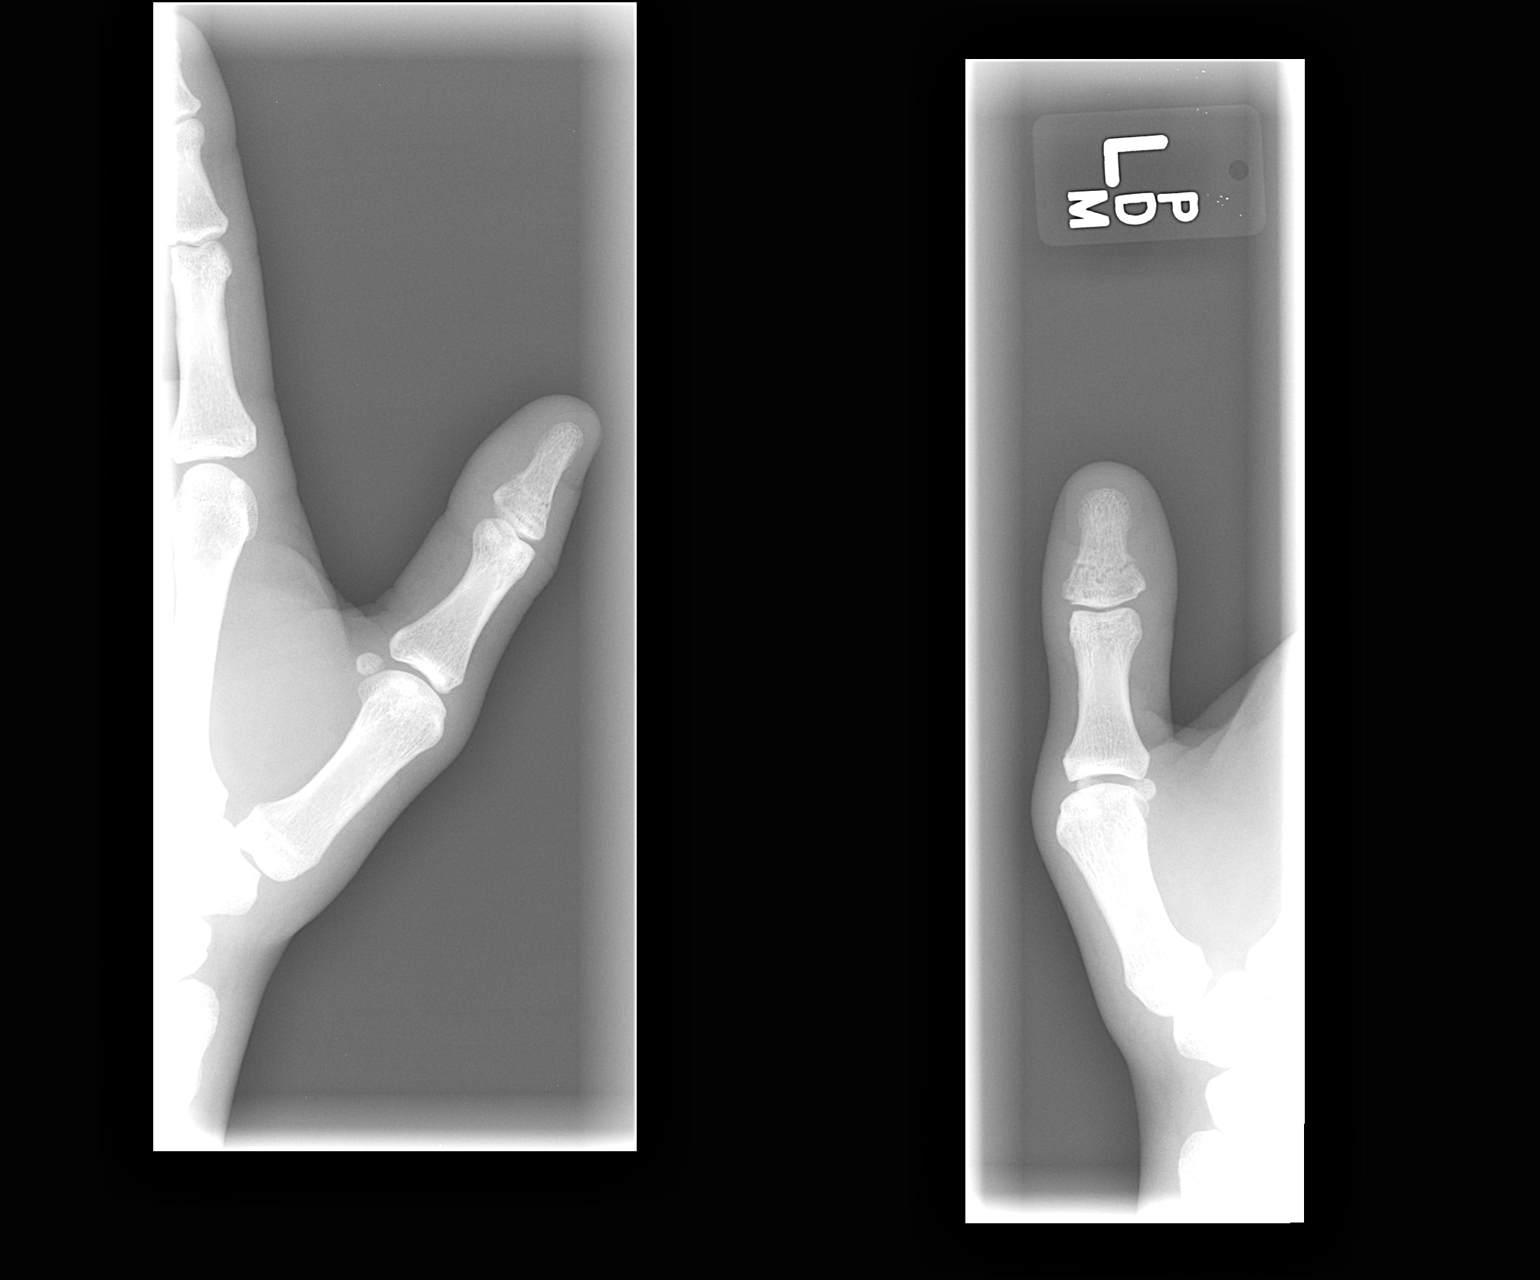

[view not recorded (2 of 2)]
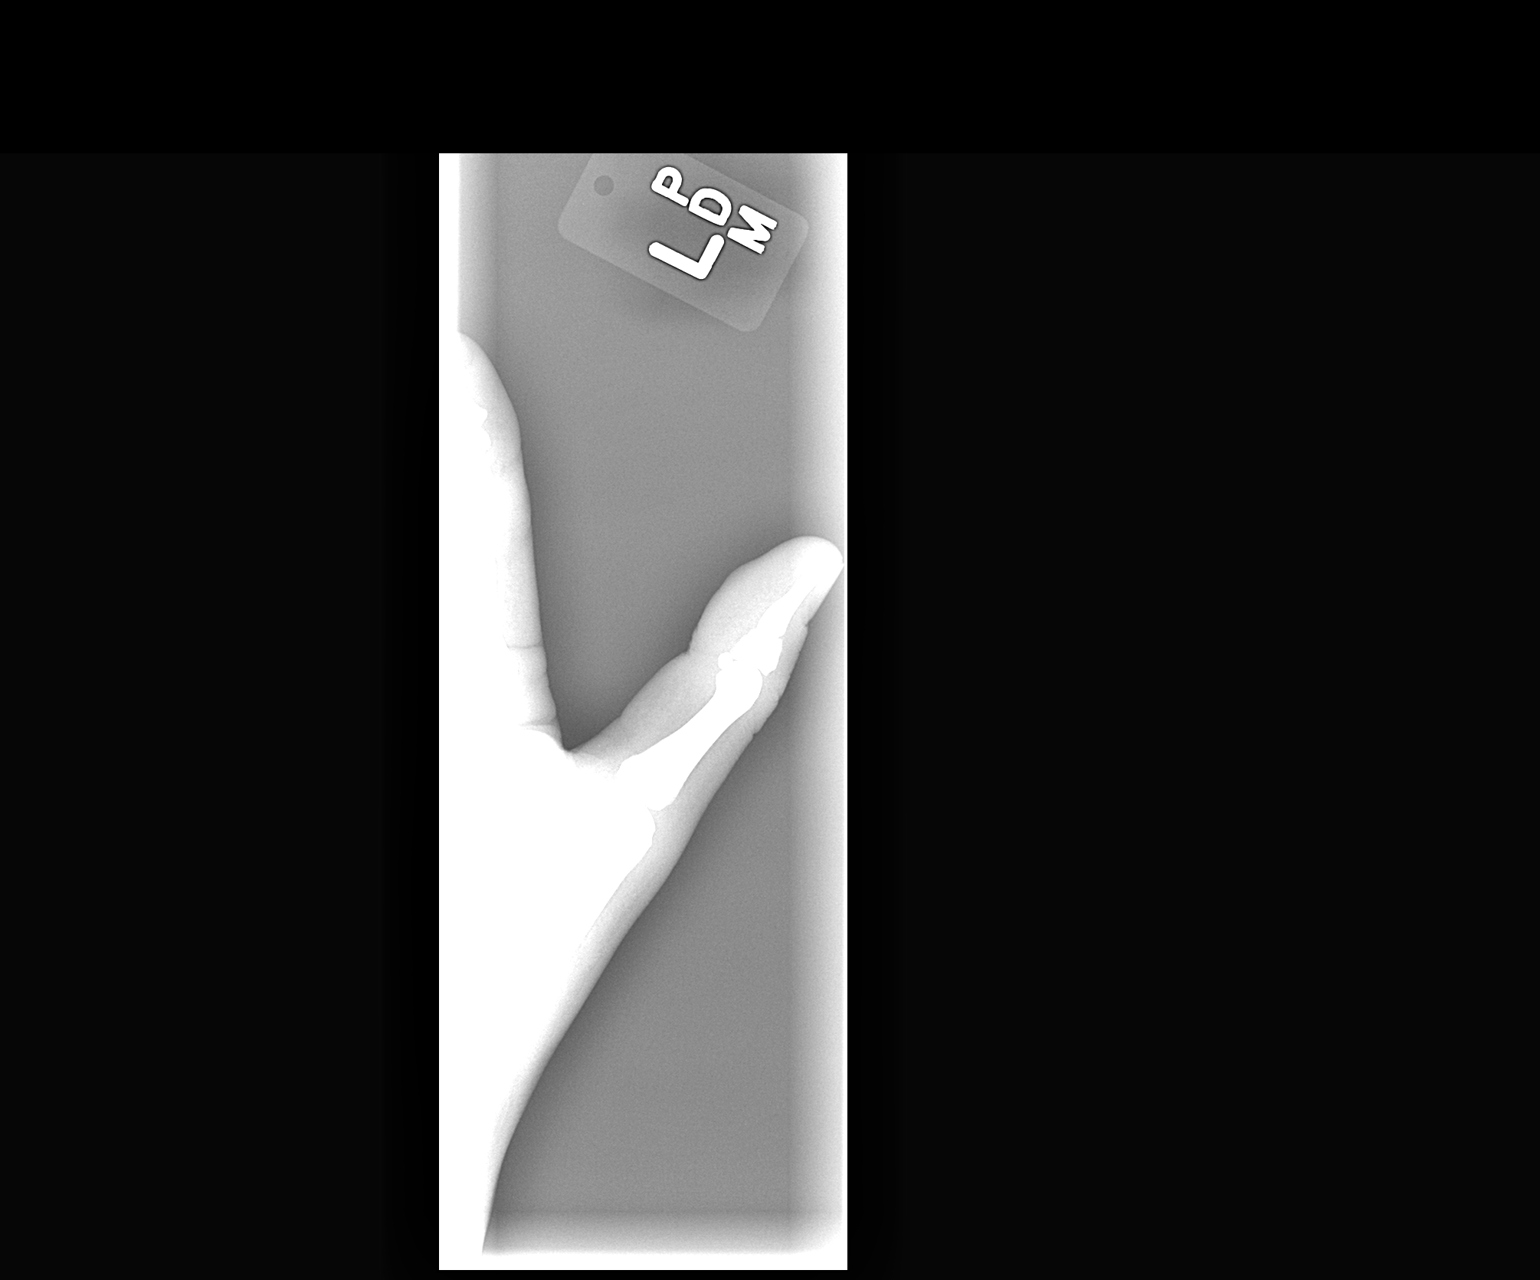

[2 of 2 positions shown; findings below may reference images not displayed]

FINDINGS: There is a nondisplaced fracture involving the distal phalanx at the
junction of the shaft and the base of the phalanx. The fracture line
remains readily visible. There is mild periosteal reaction along the
cortical edges of the fracture consistent with minimal interval
healing.
IMPRESSION: Persistent fracture first distal phalanx with very minimal interval
healing.

## 2019-03-12 ENCOUNTER — Ambulatory Visit: Payer: No Typology Code available for payment source | Attending: Internal Medicine

## 2019-03-12 DIAGNOSIS — Z23 Encounter for immunization: Secondary | ICD-10-CM | POA: Insufficient documentation

## 2019-03-12 NOTE — Progress Notes (Signed)
   Covid-19 Vaccination Clinic  Name:  Timothy Nash    MRN: [id number] DOB: February 24, 1990  03/12/2019  [name] was observed post Covid-19 immunization for 15 minutes without incident. He was provided with Vaccine Information Sheet and instruction to access the V-Safe system.   [name] was instructed to call 911 with any severe reactions post vaccine: Marland Kitchen Difficulty breathing  . Swelling of face and throat  . A fast heartbeat  . A bad rash all over body  . Dizziness and weakness   Immunizations Administered    Name Date Dose VIS Date Route   Pfizer COVID-19 Vaccine 03/12/2019  5:27 PM 0.3 mL 12/17/2018 Intramuscular   Manufacturer: [employer], [employer]   Lot: [id number]   NDC: [address]-8

## 2019-04-02 ENCOUNTER — Ambulatory Visit: Payer: No Typology Code available for payment source | Attending: Internal Medicine

## 2019-04-02 DIAGNOSIS — Z23 Encounter for immunization: Secondary | ICD-10-CM

## 2019-04-02 NOTE — Progress Notes (Signed)
   Covid-19 Vaccination Clinic  Name:  Timothy Nash    MRN: [id number] DOB: Oct 14, 1990  04/02/2019  [name] was observed post Covid-19 immunization for 15 minutes without incident. He was provided with Vaccine Information Sheet and instruction to access the V-Safe system.   [name] was instructed to call 911 with any severe reactions post vaccine: Marland Kitchen Difficulty breathing  . Swelling of face and throat  . A fast heartbeat  . A bad rash all over body  . Dizziness and weakness   Immunizations Administered    Name Date Dose VIS Date Route   Pfizer COVID-19 Vaccine 04/02/2019  9:49 AM 0.3 mL 12/17/2018 Intramuscular   Manufacturer: [employer], [employer]   Lot: [id number]   NDC: [address]-9
